# Patient Record
Sex: Male | Born: 1980 | Hispanic: No | Marital: Married | Smoking: Never smoker
Health system: Southern US, Community
[De-identification: ages and names within clinical notes are randomized; demographics above are authoritative.]

## PROBLEM LIST (undated history)

## (undated) DIAGNOSIS — E119 Type 2 diabetes mellitus without complications: Secondary | ICD-10-CM

## (undated) DIAGNOSIS — Z227 Latent tuberculosis: Secondary | ICD-10-CM

## (undated) DIAGNOSIS — E669 Obesity, unspecified: Secondary | ICD-10-CM

## (undated) HISTORY — DX: Obesity, unspecified: E66.9

## (undated) HISTORY — DX: Latent tuberculosis: Z22.7

---

## 2020-10-26 ENCOUNTER — Ambulatory Visit: Payer: Self-pay

## 2020-10-26 DIAGNOSIS — Z23 Encounter for immunization: Secondary | ICD-10-CM | POA: Diagnosis not present

## 2020-11-20 DIAGNOSIS — Z049 Encounter for examination and observation for unspecified reason: Secondary | ICD-10-CM | POA: Diagnosis not present

## 2020-11-20 DIAGNOSIS — Z111 Encounter for screening for respiratory tuberculosis: Secondary | ICD-10-CM | POA: Diagnosis not present

## 2020-11-20 DIAGNOSIS — Z114 Encounter for screening for human immunodeficiency virus [HIV]: Secondary | ICD-10-CM | POA: Diagnosis not present

## 2020-11-21 ENCOUNTER — Ambulatory Visit
Admission: RE | Admit: 2020-11-21 | Discharge: 2020-11-21 | Disposition: A | Discharge status: Home / Self Care | Payer: Medicaid Other | Source: Ambulatory Visit | Attending: Obstetrics and Gynecology | Admitting: Obstetrics and Gynecology

## 2020-11-21 ENCOUNTER — Other Ambulatory Visit: Payer: Self-pay

## 2020-11-21 ENCOUNTER — Other Ambulatory Visit: Payer: Self-pay | Admitting: Obstetrics and Gynecology

## 2020-11-21 DIAGNOSIS — R7612 Nonspecific reaction to cell mediated immunity measurement of gamma interferon antigen response without active tuberculosis: Secondary | ICD-10-CM

## 2021-01-01 DIAGNOSIS — Z111 Encounter for screening for respiratory tuberculosis: Secondary | ICD-10-CM | POA: Diagnosis not present

## 2021-01-01 DIAGNOSIS — R7612 Nonspecific reaction to cell mediated immunity measurement of gamma interferon antigen response without active tuberculosis: Secondary | ICD-10-CM | POA: Diagnosis not present

## 2021-02-06 ENCOUNTER — Other Ambulatory Visit: Payer: Self-pay | Admitting: Family Medicine

## 2021-02-06 ENCOUNTER — Other Ambulatory Visit: Payer: Self-pay

## 2021-02-06 ENCOUNTER — Other Ambulatory Visit (HOSPITAL_COMMUNITY)
Admission: RE | Admit: 2021-02-06 | Discharge: 2021-02-06 | Disposition: A | Discharge status: Home / Self Care | Payer: Medicaid Other | Source: Ambulatory Visit | Attending: Family Medicine | Admitting: Family Medicine

## 2021-02-06 ENCOUNTER — Ambulatory Visit (INDEPENDENT_AMBULATORY_CARE_PROVIDER_SITE_OTHER): Payer: Medicaid Other | Admitting: Family Medicine

## 2021-02-06 VITALS — BP 129/74 | HR 71 | Ht 69.69 in | Wt 207.6 lb

## 2021-02-06 DIAGNOSIS — H7392 Unspecified disorder of tympanic membrane, left ear: Secondary | ICD-10-CM

## 2021-02-06 DIAGNOSIS — Z8611 Personal history of tuberculosis: Secondary | ICD-10-CM | POA: Insufficient documentation

## 2021-02-06 DIAGNOSIS — K089 Disorder of teeth and supporting structures, unspecified: Secondary | ICD-10-CM | POA: Diagnosis not present

## 2021-02-06 DIAGNOSIS — Z0289 Encounter for other administrative examinations: Secondary | ICD-10-CM | POA: Diagnosis not present

## 2021-02-06 DIAGNOSIS — Z227 Latent tuberculosis: Secondary | ICD-10-CM | POA: Diagnosis not present

## 2021-02-06 LAB — POCT URINALYSIS DIP (MANUAL ENTRY)
Bilirubin, UA: NEGATIVE
Glucose, UA: NEGATIVE mg/dL
Ketones, POC UA: NEGATIVE mg/dL
Leukocytes, UA: NEGATIVE
Nitrite, UA: NEGATIVE
Protein Ur, POC: NEGATIVE mg/dL
Spec Grav, UA: >=1.03 — AB (ref 1.010–1.025)
Urobilinogen, UA: 0.2 E.U./dL
pH, UA: 5 (ref 5.0–8.0)

## 2021-02-06 LAB — POCT UA - MICROSCOPIC ONLY

## 2021-02-06 MED ORDER — IVERMECTIN 3 MG PO TABS: 200.0000 ug/kg | ORAL_TABLET | Freq: Every day | ORAL | 0 refills | Status: AC

## 2021-02-06 MED ORDER — FLUTICASONE PROPIONATE 50 MCG/ACT NA SUSP: 2.0000 | Freq: Every day | NASAL | 6 refills | Status: DC

## 2021-02-06 MED ORDER — ALBENDAZOLE 200 MG PO TABS: 400.0000 mg | ORAL_TABLET | Freq: Once | ORAL | 0 refills | Status: AC

## 2021-02-06 NOTE — Assessment & Plan Note (Addendum)
Refugee from [place] Arrived in August 2022 Labs obtained today Follow up 02/22/21 to review Empiric treatment with Ivermectin and Albendazole

## 2021-02-06 NOTE — Patient Instructions (Addendum)
It was wonderful to see you today.  Please bring ALL of your medications with you to every visit.   Today we talked about: We are referring you to the Ear Nose and Throat Doctor. You are  scheduled for May 20th at 1:45pm  [facility]  [facility] [address] [place], [place], [place] [address] [phone]  We are obtaining labs. We will discuss the labs at your follow up visit on 02/22/21 Please start taking Flonase: 2 sprays in each nostril     Thank you for choosing [facility].   Please call [phone] with any questions about today's appointment.  Please be sure to schedule follow up at the front  desk before you leave today.   Terisa Starr, MD  Family Medicine

## 2021-02-06 NOTE — Assessment & Plan Note (Signed)
IGRA positive on 06/21/20, XR normal 11/21/20.  Currently treated through Health Department INH 300mg  and Vitamin B6 25mg  started on 01/01/21.

## 2021-02-06 NOTE — Progress Notes (Signed)
Patient Name: Duane Rios Date of Birth: July 12, 1981 Date of Visit: 02/06/21 PCP: Joana Reamer, DO  Chief Complaint: refugee intake examination   The patient's preferred language is Dari. An interpreter was used for the entire visit.  Interpreter Name or ID: Mahin Taremian   Subjective: Duane Rios is a pleasant 40 y.o. presenting today for an initial refugee and immigrant clinic visit.    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 Xray 11/21/20 normal. Currently on INH 300mg  and Vitamin B6 25mg , start date 01/01/21  PSH: None  FH: Mother: HTN, deceased   Allergies:  None  Current Medications:  INH 300mg  and Vitamin B6 25mg  QD  Social History: Tobacco Use: None Alcohol Use: NOne  In the past two weeks, have you run out of food before you had money to purchase more? No In the past two weeks, have you had difficulty with obtaining food for your family? Sometimes, uses bus to get around  Information Number of Immediate Family Members: 7 (2 brothers, 2 sisters, 2 children, 1 spouse) Number of Immediate Family Members in 01/03/21: 4 Country of Birth: Country of Origin: Location of Refugee [place]:  ([place], [place]) Duration in [place]: 0-1 years Reason for Leaving Home Country:  (War) Primary Language: Dari Able to Read in Primary Language: No Able to Write in Primary Language: No Education: [employer] (3rd-5th grade level) Prior Work: Worked in [place] as [place] Marital Status: Married [place] Health Department: Positive Health Department Labs Completed: No History of Trauma: None Do You Feel Jumpy or Nervous?: No Are You Very Watchful or 'Super Alert'?: No  Date of Overseas Exam: None Review of Overseas Exam: Not applicable  Pre-Departure Treatment: none3  Overseas Vaccines Reviewed and Updated in Epic Not applicable Health Department Vaccines and imaging reviewed and  updated in Epic: yes   Vitals:   02/06/21 1343  BP: 129/74  Pulse: 71  SpO2: 98%   HEENT: Sclera anicteric. Dentition is poor throughout. No signs of acute infection. Appears well hydrated. Diffusely opaque left TM with area of erythema, no bulging.  +3 Tonsillar hypertrophy Neck: Supple, no thyromegaly, no cervical lymphadenopathy Cardiac: Regular rate and rhythm. Normal S1/S2. No murmurs, rubs, or gallops appreciated. Lungs: Clear bilaterally to ascultation.  Abdomen: Normoactive bowel sounds. No tenderness to deep or light palpation. No rebound or guarding. No hepatosplenomegaly  Extremities: Warm, well perfused without edema.  Skin: warm and dry Psych: Pleasant and appropriate  MSK: normal strength and tone, normal gait  Depression screen [facility] 2/9 02/06/2021  Decreased Interest 0  Down, Depressed, Hopeless 0  PHQ - 2 Score 0  Altered sleeping 0  Tired, decreased energy 0  Change in appetite 0  Feeling bad or failure about yourself  0  Trouble concentrating 0  Moving slowly or fidgety/restless 0  Suicidal thoughts 0  PHQ-9 Score 0    TB lung, latent IGRA positive on 06/21/20, XR normal 11/21/20.  Currently treated through Health Department INH 300mg  and Vitamin B6 25mg  started on 01/01/21.   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 Unclear if chronic fluid vs cholesteatoma. Hearing exam abnormal. Does have evidence of tonsillar hypertrophy Will refer to ENT for further evaluation. Scheduled for 03/02/21 at 1:45pm Treat with Flonase daily  Refugee health examination Refugee from 08/21/20 Arrived in August 2022 Labs obtained today Follow up 02/22/21 to review  Empiric treatment with Ivermectin and Albendazole  I have personally updated the history tabs within Epic and included the refugee information in social documentation.   Designated Market researcher signed with agency.   Release of information  signed for Health Department.   Return to care in 1 month in [facility] with resident physician and PCP - scheduled 02/22/21  Vaccines: Will need Hep A and varicella. Possible TDAP (received Td)  Orpah Cobb, DO [facility], PGY3 02/06/2021 4:33 PM

## 2021-02-06 NOTE — Assessment & Plan Note (Addendum)
Will provide dental list at follow up visit 

## 2021-02-06 NOTE — Assessment & Plan Note (Addendum)
Chronic. Unclear if chronic fluid vs cholesteatoma. Hearing exam abnormal. Does have evidence of tonsillar hypertrophy Will refer to ENT for further evaluation. Scheduled for 03/02/21 at 1:45pm Treat with Flonase daily

## 2021-02-07 LAB — CBC WITH DIFFERENTIAL/PLATELET
Basophils Absolute: 0.1 10*3/uL (ref 0.0–0.2)
Basos: 1 %
EOS (ABSOLUTE): 0.1 10*3/uL (ref 0.0–0.4)
Eos: 2 %
Hematocrit: 43.9 % (ref 37.5–51.0)
Hemoglobin: 14.9 g/dL (ref 13.0–17.7)
Immature Grans (Abs): 0 10*3/uL (ref 0.0–0.1)
Immature Granulocytes: 0 %
Lymphocytes Absolute: 2.1 10*3/uL (ref 0.7–3.1)
Lymphs: 41 %
MCH: 27.2 pg (ref 26.6–33.0)
MCHC: 33.9 g/dL (ref 31.5–35.7)
MCV: 80 fL (ref 79–97)
Monocytes Absolute: 0.4 10*3/uL (ref 0.1–0.9)
Monocytes: 8 %
Neutrophils Absolute: 2.5 10*3/uL (ref 1.4–7.0)
Neutrophils: 48 %
Platelets: 322 10*3/uL (ref 150–450)
RBC: 5.47 x10E6/uL (ref 4.14–5.80)
RDW: 11.8 % (ref 11.6–15.4)
WBC: 5.1 10*3/uL (ref 3.4–10.8)

## 2021-02-07 LAB — COMPREHENSIVE METABOLIC PANEL
ALT: 26 IU/L (ref 0–44)
AST: 15 IU/L (ref 0–40)
Albumin/Globulin Ratio: 1.6 (ref 1.2–2.2)
Albumin: 4.4 g/dL (ref 4.0–5.0)
Alkaline Phosphatase: 93 IU/L (ref 44–121)
BUN/Creatinine Ratio: 11 (ref 9–20)
BUN: 10 mg/dL (ref 6–20)
Bilirubin Total: 0.4 mg/dL (ref 0.0–1.2)
CO2: 23 mmol/L (ref 20–29)
Calcium: 9.7 mg/dL (ref 8.7–10.2)
Chloride: 105 mmol/L (ref 96–106)
Creatinine, Ser: 0.88 mg/dL (ref 0.76–1.27)
Globulin, Total: 2.8 g/dL (ref 1.5–4.5)
Glucose: 112 mg/dL — ABNORMAL HIGH (ref 65–99)
Potassium: 4.2 mmol/L (ref 3.5–5.2)
Sodium: 141 mmol/L (ref 134–144)
Total Protein: 7.2 g/dL (ref 6.0–8.5)
eGFR: 113 mL/min/{1.73_m2} (ref >59)

## 2021-02-07 LAB — LIPID PANEL
Chol/HDL Ratio: 5.1 ratio — ABNORMAL HIGH (ref 0.0–5.0)
Cholesterol, Total: 220 mg/dL — ABNORMAL HIGH (ref 100–199)
HDL: 43 mg/dL (ref >39)
LDL Chol Calc (NIH): 155 mg/dL — ABNORMAL HIGH (ref 0–99)
Triglycerides: 121 mg/dL (ref 0–149)
VLDL Cholesterol Cal: 22 mg/dL (ref 5–40)

## 2021-02-07 LAB — HEPATITIS B CORE ANTIBODY, TOTAL: Hep B Core Total Ab: NEGATIVE

## 2021-02-07 LAB — TSH: TSH: 0.982 u[IU]/mL (ref 0.450–4.500)

## 2021-02-07 LAB — HIV ANTIBODY (ROUTINE TESTING W REFLEX): HIV Screen 4th Generation wRfx: NONREACTIVE

## 2021-02-07 LAB — HEPATITIS B SURFACE ANTIBODY, QUANTITATIVE: Hepatitis B Surf Ab Quant: 4.8 m[IU]/mL — ABNORMAL LOW (ref >9.9)

## 2021-02-07 LAB — HEPATITIS B SURFACE ANTIGEN: Hepatitis B Surface Ag: NEGATIVE

## 2021-02-07 LAB — HCV INTERPRETATION

## 2021-02-07 LAB — HCV AB W REFLEX TO QUANT PCR: HCV Ab: <0.1 s/co ratio (ref 0.0–0.9)

## 2021-02-07 LAB — RPR: RPR Ser Ql: NONREACTIVE

## 2021-02-08 LAB — URINE CYTOLOGY ANCILLARY ONLY
Chlamydia: NEGATIVE
Comment: NEGATIVE
Comment: NORMAL
Neisseria Gonorrhea: NEGATIVE

## 2021-02-09 LAB — URINE CULTURE: Organism ID, Bacteria: NO GROWTH

## 2021-02-18 NOTE — Progress Notes (Signed)
   Subjective:   Patient ID: Duane Rios    DOB: 19-Jun-1981, 40 y.o. male   MRN: [id number]  Blas Riches is a 40 y.o. male with a history of latent TB, poor dentition, abnormal TM of left ear here for follow up  Acute Concerns: Unable to afford the Ivermectin medication. They completed the Albendazole without complication.  HPI: Patient here to discuss lab results  Review of Systems:  Per HPI.   Objective:   BP (!) 135/93   Pulse 84   Wt 209 lb 6.4 oz (95 kg)   SpO2 99%   BMI 30.32 kg/m  Vitals and nursing note reviewed.  General: pleasant older male, sitting comfortably in exam chair, well nourished, well developed, in no acute distress with non-toxic appearance Resp: breathing comfortably on room air, speaking in full sentences MSK:  gait normal Neuro: Alert and oriented, speech normal  Assessment & Plan:   Refugee health examination New rx for Ivermectin provided and encouraged to go to [facility]. Will need to complete PA to have insurance cover medication otherwise will be $30 out of pocket. Will await forms to complete.   Mixed hyperlipidemia Discussed lab results. Recommended lifestyle modifications.  Nonimmune to hepatitis B virus Has received 1 dose of Hep B 10/26/20 Dose #2 given 02/22/21 Dose #3 can be given in 2 months (8 weeks after dose 2, at least 16 weeks from dose 1)  Orders Placed This Encounter  Procedures  . Hepatitis B vaccine adult IM   Meds ordered this encounter  Medications  . DISCONTD: ivermectin (STROMECTOL) 3 MG TABS tablet    Sig: Take 6.5 tablets (19.5 mg total) by mouth daily for 2 doses.    Dispense:  13 tablet    Refill:  0  . DISCONTD: albendazole (ALBENZA) 200 MG tablet    Sig: Take 2 tablets (400 mg total) by mouth once for 1 dose.    Dispense:  2 tablet    Refill:  0      Orpah Cobb, DO PGY-3, [facility] 02/23/2021 8:07 AM

## 2021-02-22 ENCOUNTER — Encounter: Payer: Self-pay | Admitting: Family Medicine

## 2021-02-22 ENCOUNTER — Other Ambulatory Visit: Payer: Self-pay

## 2021-02-22 ENCOUNTER — Other Ambulatory Visit (HOSPITAL_COMMUNITY): Payer: Self-pay

## 2021-02-22 ENCOUNTER — Ambulatory Visit (INDEPENDENT_AMBULATORY_CARE_PROVIDER_SITE_OTHER): Payer: Medicaid Other | Admitting: Family Medicine

## 2021-02-22 VITALS — BP 135/93 | HR 84 | Wt 209.4 lb

## 2021-02-22 DIAGNOSIS — E782 Mixed hyperlipidemia: Secondary | ICD-10-CM

## 2021-02-22 DIAGNOSIS — Z0289 Encounter for other administrative examinations: Secondary | ICD-10-CM

## 2021-02-22 DIAGNOSIS — Z23 Encounter for immunization: Secondary | ICD-10-CM

## 2021-02-22 DIAGNOSIS — Z789 Other specified health status: Secondary | ICD-10-CM

## 2021-02-22 MED ORDER — IVERMECTIN 3 MG PO TABS
19.0000 mg | ORAL_TABLET | Freq: Every day | ORAL | 0 refills | Status: DC
  Filled 2021-02-22: qty 10, 29d supply, fill #0

## 2021-02-22 MED ORDER — IVERMECTIN 3 MG PO TABS
19.0000 mg | ORAL_TABLET | Freq: Every day | ORAL | 0 refills | Status: AC
  Filled 2021-02-22: qty 10, 14d supply, fill #0
  Filled 2021-02-22: qty 13, 2d supply, fill #0
  Filled 2021-02-23: qty 3, 3d supply, fill #1

## 2021-02-22 MED ORDER — ALBENDAZOLE 200 MG PO TABS: 400.0000 mg | ORAL_TABLET | Freq: Once | ORAL | 0 refills | Status: DC

## 2021-02-22 NOTE — Patient Instructions (Addendum)
Please go to [facility] at  [address] [place], [place], [place] [address]  to obtain the Ivermectin   You also got your Hepatitis B vaccine today. The next vaccine will need to be given in 2 months.

## 2021-02-23 ENCOUNTER — Other Ambulatory Visit (HOSPITAL_COMMUNITY): Payer: Self-pay

## 2021-02-23 DIAGNOSIS — E782 Mixed hyperlipidemia: Secondary | ICD-10-CM | POA: Insufficient documentation

## 2021-02-23 DIAGNOSIS — Z789 Other specified health status: Secondary | ICD-10-CM | POA: Insufficient documentation

## 2021-02-23 NOTE — Assessment & Plan Note (Signed)
Discussed lab results. Recommended lifestyle modifications.

## 2021-02-23 NOTE — Assessment & Plan Note (Signed)
New rx for Ivermectin provided and encouraged to go to [facility]. Will need to complete PA to have insurance cover medication otherwise will be $30 out of pocket. Will await forms to complete.

## 2021-02-23 NOTE — Assessment & Plan Note (Signed)
Has received 1 dose of Hep B 10/26/20 Dose #2 given 02/22/21 Dose #3 can be given in 2 months (8 weeks after dose 2, at least 16 weeks from dose 1)

## 2021-04-06 DIAGNOSIS — H903 Sensorineural hearing loss, bilateral: Secondary | ICD-10-CM | POA: Diagnosis not present

## 2021-04-06 DIAGNOSIS — H7312 Chronic myringitis, left ear: Secondary | ICD-10-CM | POA: Diagnosis not present

## 2021-04-20 DIAGNOSIS — Z09 Encounter for follow-up examination after completed treatment for conditions other than malignant neoplasm: Secondary | ICD-10-CM | POA: Diagnosis not present

## 2021-04-20 DIAGNOSIS — Z8669 Personal history of other diseases of the nervous system and sense organs: Secondary | ICD-10-CM | POA: Diagnosis not present

## 2021-07-24 DIAGNOSIS — Z7189 Other specified counseling: Secondary | ICD-10-CM | POA: Diagnosis not present

## 2021-07-24 DIAGNOSIS — Z23 Encounter for immunization: Secondary | ICD-10-CM | POA: Diagnosis not present

## 2021-08-20 DIAGNOSIS — Z23 Encounter for immunization: Secondary | ICD-10-CM | POA: Diagnosis not present

## 2021-10-28 IMAGING — CR DG CHEST 1V
1 series · 1 of 1 positions shown · non-contrast
Comparison: None

CLINICAL DATA: Positive TB test, no chest complaints, nonsmoker

EXAM:
CHEST  1 VIEW

[w chest pa]
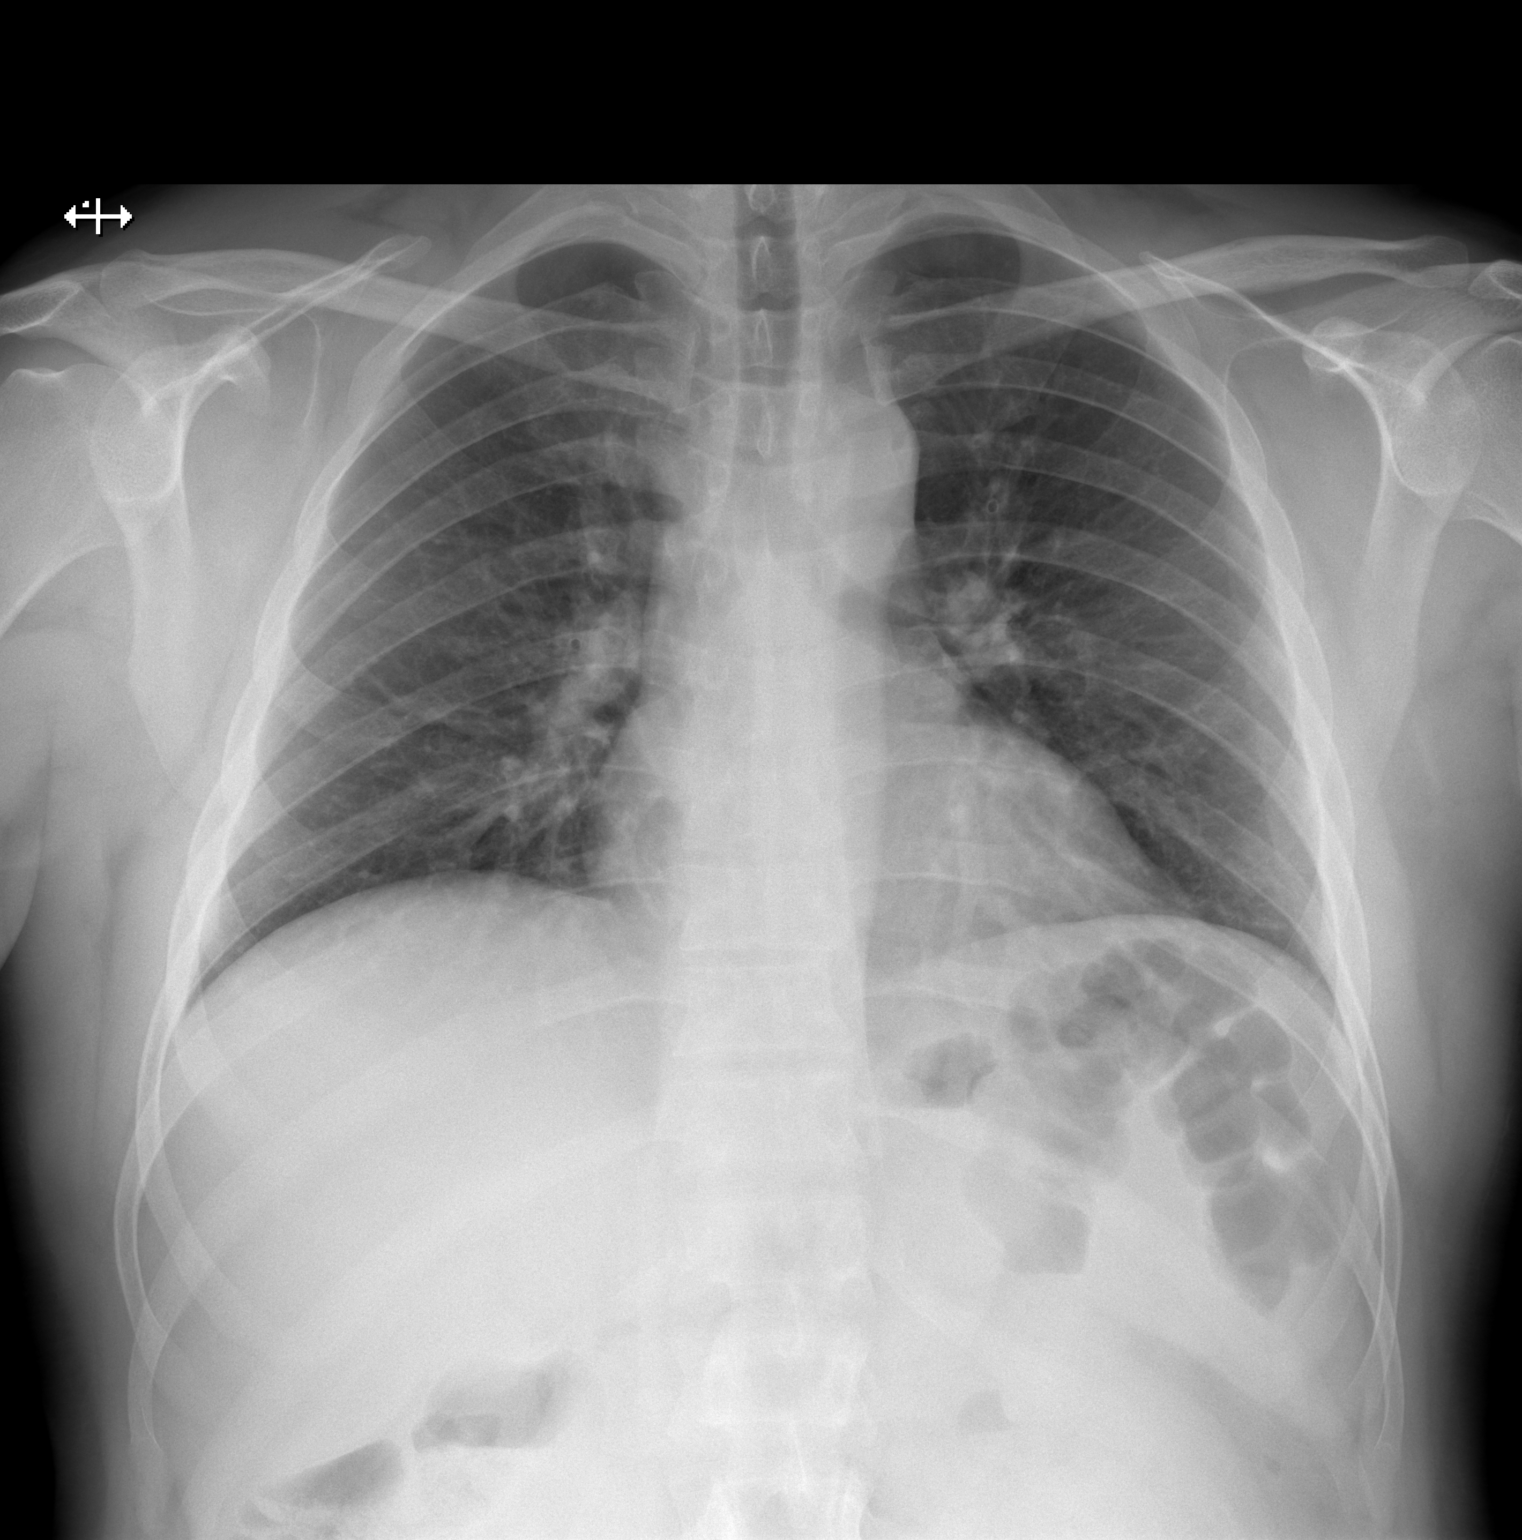

[1 of 1 positions shown; findings below may reference images not displayed]

FINDINGS: No consolidation, features of edema, pneumothorax, or effusion. No
suspicious pulmonary nodules or masses. No significant architectural
distortion or cavitary change. The cardiomediastinal contours are
unremarkable. No acute osseous or soft tissue abnormality.
IMPRESSION: Unremarkable chest radiograph

## 2022-02-04 ENCOUNTER — Encounter: Payer: Self-pay | Admitting: Family Medicine

## 2022-02-04 ENCOUNTER — Ambulatory Visit (INDEPENDENT_AMBULATORY_CARE_PROVIDER_SITE_OTHER): Payer: Medicaid Other | Admitting: Family Medicine

## 2022-02-04 VITALS — BP 128/82 | HR 84 | Ht 70.0 in | Wt 197.2 lb

## 2022-02-04 DIAGNOSIS — Z0000 Encounter for general adult medical examination without abnormal findings: Secondary | ICD-10-CM

## 2022-02-04 NOTE — Progress Notes (Signed)
? ? ? ? ?  SUBJECTIVE:  ? ?Chief compliant/HPI: annual examination ? ?Duane Rios is a 41 y.o. who presents today for an annual exam.  ? ?Dari IPAD interpreter used. ID # [id number] ? 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 ?Denies taking medication ? ?Denies tobacco, alcohol, or recreation drug use ? ?Married ?Endorses mood is good. No concern for anxiety or depression ? ? ?OBJECTIVE:  ? ?BP 128/82   Pulse 84   Ht 5\' 10"  (1.778 m)   Wt 197 lb 3.2 oz (89.4 kg)   SpO2 99%   BMI 28.30 kg/m?   ? ?Physical exam ?General: well appearing, NAD ?Cardiovascular: RRR, no murmurs ?Lungs: CTAB. Normal WOB ?Abdomen: soft, non-distended, non-tender ?Skin: warm, dry. No edema ? ?ASSESSMENT/PLAN:  ? ?No problem-specific Assessment & Plan notes found for this encounter. ?  ? ?Annual Examination  ?See AVS for age appropriate recommendations  ?PHQ score 0, reviewed and discussed.  ?Blood pressure reviewed and at goal.    ? ?Considered the following items based upon USPSTF recommendations: ?HIV testing: not indicated ?Hepatitis C: not indicated ?Hepatitis B: not indicated ?Syphilis if at high risk: {not indicated ?GC/CTnot indicated ?Lipid panel (nonfasting or fasting) discussed based upon AHA recommendations and not ordered.  Consider repeat every 4-6 years.  ? ? ?Follow up in 1 year or sooner if indicated.  ? ? ? , DO ?[facility]   ?

## 2022-02-04 NOTE — Patient Instructions (Addendum)
It was great seeing you today! ? ?You came in for your annual exam and glad to see you are doing well!  ? ?Continue watching nutrition, and eating well-balanced meals with vegetables at each meal, and getting in physical activity when you can.  ? ?We will see you back in 1 year for your next physical, but if you need to be seen earlier than that for any new issues we're happy to fit you in, just give [place] a call! ? ?Visit Reminders: ?- Continue to work on your healthy eating habits and incorporating exercise into your daily life.  ? ?Feel free to call with any questions or concerns at any time, at [phone]. ?  ?Take care,  ?Dr. Shary Key ?[place]  ?

## 2022-10-19 ENCOUNTER — Ambulatory Visit (HOSPITAL_COMMUNITY)
Admission: EM | Admit: 2022-10-19 | Discharge: 2022-10-19 | Disposition: A | Discharge status: Home / Self Care | Payer: BC Managed Care – PPO | Attending: Emergency Medicine | Admitting: Emergency Medicine

## 2022-10-19 ENCOUNTER — Encounter (HOSPITAL_COMMUNITY): Payer: Self-pay | Admitting: *Deleted

## 2022-10-19 ENCOUNTER — Other Ambulatory Visit: Payer: Self-pay

## 2022-10-19 DIAGNOSIS — S29019A Strain of muscle and tendon of unspecified wall of thorax, initial encounter: Secondary | ICD-10-CM

## 2022-10-19 DIAGNOSIS — M62830 Muscle spasm of back: Secondary | ICD-10-CM

## 2022-10-19 DIAGNOSIS — S29012A Strain of muscle and tendon of back wall of thorax, initial encounter: Secondary | ICD-10-CM

## 2022-10-19 MED ORDER — KETOROLAC TROMETHAMINE 60 MG/2ML IM SOLN
60.0000 mg | Freq: Once | INTRAMUSCULAR | Status: AC
  Administered 2022-10-19: 60 mg via INTRAMUSCULAR

## 2022-10-19 MED ORDER — KETOROLAC TROMETHAMINE 60 MG/2ML IM SOLN
INTRAMUSCULAR | Status: AC
  Filled 2022-10-19: qty 2

## 2022-10-19 MED ORDER — CYCLOBENZAPRINE HCL 10 MG PO TABS: 10.0000 mg | ORAL_TABLET | Freq: Two times a day (BID) | ORAL | 0 refills | Status: AC | PRN

## 2022-10-19 NOTE — Discharge Instructions (Addendum)
Apply heat or ice to back 20 min 3 times daily. You received Toradol in Urgent Care. Take flexeril as directed(will make you sleepy). May alternate tylenol and ibuprofen as label directed for pain. Follow up with WC/Occupational health at [facility] for appt. Go to Er for loss of function, loss of bowel and bladder, saddle numbness.

## 2022-10-19 NOTE — ED Triage Notes (Signed)
Pt reports mid back after doing heavy work at work.

## 2022-10-19 NOTE — ED Provider Notes (Signed)
[facility]    CSN: [id number] Arrival date & time: 10/19/22  1208      History   Chief Complaint Chief Complaint  Patient presents with   Back Pain    HPI Duane Rios is a 42 y.o. male.   42 year old male patient, Duane Rios, presents to urgent care with chief complaint of mid upper back after doing heavy pulling and physical activity at work.  Was seen by occupational health 2 days prior and given Tylenol and advised to ice, no improvement.  Pain with movement.  Patient denies any fall or direct blunt force trauma to area. Pt denies any loss of function, no saddle numbness, no loss of bowel and bladder.   The history is provided by the patient. No language interpreter was used.    Past Medical History:  Diagnosis Date   Obesity    TB lung, latent     Patient Active Problem List   Diagnosis Date Noted   Thoracic myofascial strain 10/19/2022   Muscle spasm of back 10/19/2022   Nonimmune to hepatitis B virus 02/23/2021   Mixed hyperlipidemia 02/23/2021   Poor dentition 02/06/2021   Abnormal tympanic membrane of left ear 02/06/2021   Refugee health examination 02/06/2021   TB lung, latent 02/06/2021    History reviewed. No pertinent surgical history.     Home Medications    Prior to Admission medications   Medication Sig Start Date End Date Taking? Authorizing Provider  cyclobenzaprine (FLEXERIL) 10 MG tablet Take 1 tablet (10 mg total) by mouth 2 (two) times daily as needed for up to 5 days for muscle spasms. 10/19/22 10/24/22 Yes Corwyn Vora, Para March, NP  fluticasone (FLONASE) 50 MCG/ACT nasal spray Place 2 sprays into both nostrils daily. 02/06/21   Mullis, Dara Lords, DO    Family History Family History  Problem Relation Age of Onset   Hypertension Mother     Social History Social History   Tobacco Use   Smoking status: Never   Smokeless tobacco: Never  Vaping Use   Vaping Use: Never used  Substance Use Topics   Alcohol use: Never    Drug use: Never     Allergies   Patient has no known allergies.   Review of Systems Review of Systems  Genitourinary:  Negative for dysuria.  Musculoskeletal:  Positive for back pain and myalgias.  Skin:  Negative for rash.  All other systems reviewed and are negative.    Physical Exam Triage Vital Signs ED Triage Vitals [10/19/22 1228]  Enc Vitals Group     BP      Pulse      Resp      Temp      Temp src      SpO2      Weight      Height      Head Circumference      Peak Flow      Pain Score 7     Pain Loc      Pain Edu?      Excl. in GC?    No data found.  Updated Vital Signs BP 119/77   Pulse 83   Temp 98.6 F (37 C)   Resp 18   SpO2 98%   Visual Acuity Right Eye Distance:   Left Eye Distance:   Bilateral Distance:    Right Eye Near:   Left Eye Near:    Bilateral Near:     Physical Exam Vitals and nursing note  reviewed.  Constitutional:      Appearance: He is well-developed and well-groomed. He is not ill-appearing or toxic-appearing.  Cardiovascular:     Rate and Rhythm: Normal rate and regular rhythm.     Pulses: Normal pulses.          Dorsalis pedis pulses are 2+ on the right side and 2+ on the left side.     Heart sounds: Normal heart sounds.  Pulmonary:     Effort: Pulmonary effort is normal.     Breath sounds: Normal breath sounds and air entry.  Musculoskeletal:     Thoracic back: Spasms present. No bony tenderness.     Lumbar back: No swelling, edema, deformity, lacerations or bony tenderness. Normal range of motion.       Back:     Comments: Pain with movement, + spasm/tightness  Neurological:     General: No focal deficit present.     Mental Status: He is alert and oriented to person, place, and time.     GCS: GCS eye subscore is 4. GCS verbal subscore is 5. GCS motor subscore is 6.     Cranial Nerves: No cranial nerve deficit.     Sensory: No sensory deficit.     Deep Tendon Reflexes: Reflexes are normal and symmetric.   Psychiatric:        Attention and Perception: Attention normal.        Mood and Affect: Mood normal.        Speech: Speech normal.        Behavior: Behavior is cooperative.      UC Treatments / Results  Labs (all labs ordered are listed, but only abnormal results are displayed) Labs Reviewed - No data to display  EKG   Radiology No results found.  Procedures Procedures (including critical care time)  Medications Ordered in UC Medications  ketorolac (TORADOL) injection 60 mg (60 mg Intramuscular Given 10/19/22 1321)    Initial Impression / Assessment and Plan / UC Course  I have reviewed the triage vital signs and the nursing notes.  Pertinent labs & imaging results that were available during my care of the patient were reviewed by me and considered in my medical decision making (see chart for details).     Ddx: Back pain,spasm, muscle strain  Toradol 60mg  IM given in UC for pain.  Final Clinical Impressions(s) / UC Diagnoses   Final diagnoses:  Thoracic myofascial strain, initial encounter  Muscle spasm of back     Discharge Instructions      Apply heat or ice to back 20 min 3 times daily. You received Toradol in Urgent Care. Take flexeril as directed(will make you sleepy). May alternate tylenol and ibuprofen as label directed for pain. Follow up with WC/Occupational health at [facility] for appt. Go to Er for loss of function, loss of bowel and bladder, saddle numbness.      ED Prescriptions     Medication Sig Dispense Auth. Provider   cyclobenzaprine (FLEXERIL) 10 MG tablet Take 1 tablet (10 mg total) by mouth 2 (two) times daily as needed for up to 5 days for muscle spasms. 10 tablet Lexus Barletta, Jeanett Schlein, NP      PDMP not reviewed this encounter.   Tori Milks, NP 53/97/67 1554

## 2023-08-01 ENCOUNTER — Other Ambulatory Visit: Payer: Self-pay

## 2023-08-01 ENCOUNTER — Ambulatory Visit (INDEPENDENT_AMBULATORY_CARE_PROVIDER_SITE_OTHER): Payer: Medicaid Other | Admitting: Family Medicine

## 2023-08-01 ENCOUNTER — Other Ambulatory Visit (HOSPITAL_COMMUNITY)
Admission: RE | Admit: 2023-08-01 | Discharge: 2023-08-01 | Disposition: A | Discharge status: Home / Self Care | Payer: Medicaid Other | Source: Ambulatory Visit | Attending: Family Medicine | Admitting: Family Medicine

## 2023-08-01 VITALS — BP 135/99 | HR 72 | Ht 70.0 in | Wt 214.0 lb

## 2023-08-01 DIAGNOSIS — R300 Dysuria: Secondary | ICD-10-CM | POA: Insufficient documentation

## 2023-08-01 DIAGNOSIS — Z8611 Personal history of tuberculosis: Secondary | ICD-10-CM

## 2023-08-01 DIAGNOSIS — E782 Mixed hyperlipidemia: Secondary | ICD-10-CM | POA: Diagnosis not present

## 2023-08-01 DIAGNOSIS — Z23 Encounter for immunization: Secondary | ICD-10-CM

## 2023-08-01 LAB — POCT UA - MICROSCOPIC ONLY
Bacteria, U Microscopic: NONE SEEN
WBC, Ur, HPF, POC: NONE SEEN (ref 0–5)

## 2023-08-01 NOTE — Progress Notes (Unsigned)
    SUBJECTIVE:   CHIEF COMPLAINT / HPI:   UTI Symptoms  Patient has been having dysuria for one week now. Patient had frequency.  Noticed two "stones" that were brown in color. He felt like he would sometimes go to the bathroom and felt like he could not urinate much. Denies urgency. Has never had stones before. About 4-5 years ago in [place] patient had similar symptoms and was told that he had a UTI. He says at that time he was treated and felt better. Since then occasionally he would have burning with urination but would be self limited. No back pain. No fever. No abdominal pain, nausea, vomiting. Patient says he gets white discharge.   Denies history of STI. Denies kidney problems in the past.   No family history of kidney or bladder problems.   PERTINENT  PMH / PSH: latent TB, treated.   OBJECTIVE:   BP (!) 136/100   Pulse 83   Ht 5\' 10"  (1.778 m)   Wt 214 lb (97.1 kg)   SpO2 98%   BMI 30.71 kg/m   General: well appearing, in no acute distress CV: RRR, radial pulses equal and palpable Resp: Normal work of breathing on room air Abd: Soft, non tender, non distended  Neuro: Alert & Oriented x 4    ASSESSMENT/PLAN:   Assessment & Plan Dysuria Concern for possible chronic prostatitis given that patient has had this long indolent course.  Additionally presence of pus like discharge also could be an indicator as patient denies history of STIs or sexual activity other than 1 partner.  Could also be concerning for UTI. - If UA is significant for bacteria would treat with antibiotics given could be either UTI or acute on chronic bacterial prostatitis -If UA not show any bacteria, would treat with Pyridium for inflammation and NSAIDs and refer to urology for chronic prostatitis -Urine GC/CT, HIV, CMP   Healthcare maintenance - Lipid panel - Flu and COVID vaccines - Recommended patient make appointment for physical with primary care physician   Lockie Mola, MD [facility]  [facility]

## 2023-08-01 NOTE — Patient Instructions (Addendum)
???? ?? ????? ????? ?????.   ??? ??? ???? ?? ????? ????? ????? ?? ????? ????????? ??????? ????. ?? ??????? ????? ??? ?? ?? ????? ??? ? ?? ???? ?? ???? ?????? ?? ???? ???? ???? ?????? ????? ???. ?? ??? ??? ???????? ??????? ? ?????????? ?? ???? ??? ?? ??? ???? ????.  ??????? ?? ??????? ????? ????? ????????? ???? ? ???? ??? ???? ??? ??? ????????? ?????.   ??? ???? ?? ?? ??? ???? ?? ?????? ????? ?? ????? ??? ???? ?????? ???????.     Thank you for coming today.   Your pain could be from urinary tract infection or bacterial infection of the prostate. I will check your urine studies and follow up regarding an antibiotic to take. In the meantime you can take tylenol and ibuprofen to help with pain.  Additionally I am going to get some labs because of your history of high cholesterol and high blood pressure.   You should make an appointment for a physical with your doctor in a month.

## 2023-08-02 LAB — COMPREHENSIVE METABOLIC PANEL
ALT: 33 [IU]/L (ref 0–44)
AST: 19 [IU]/L (ref 0–40)
Albumin: 4.4 g/dL (ref 4.1–5.1)
Alkaline Phosphatase: 106 [IU]/L (ref 44–121)
BUN/Creatinine Ratio: 17 (ref 9–20)
BUN: 16 mg/dL (ref 6–24)
Bilirubin Total: 0.4 mg/dL (ref 0.0–1.2)
CO2: 23 mmol/L (ref 20–29)
Calcium: 9.6 mg/dL (ref 8.7–10.2)
Chloride: 103 mmol/L (ref 96–106)
Creatinine, Ser: 0.95 mg/dL (ref 0.76–1.27)
Globulin, Total: 2.8 g/dL (ref 1.5–4.5)
Glucose: 96 mg/dL (ref 70–99)
Potassium: 4.2 mmol/L (ref 3.5–5.2)
Sodium: 139 mmol/L (ref 134–144)
Total Protein: 7.2 g/dL (ref 6.0–8.5)
eGFR: 104 mL/min/{1.73_m2} (ref >59)

## 2023-08-02 LAB — LIPID PANEL
Chol/HDL Ratio: 6.1 {ratio} — ABNORMAL HIGH (ref 0.0–5.0)
Cholesterol, Total: 230 mg/dL — ABNORMAL HIGH (ref 100–199)
HDL: 38 mg/dL — ABNORMAL LOW (ref >39)
LDL Chol Calc (NIH): 146 mg/dL — ABNORMAL HIGH (ref 0–99)
Triglycerides: 249 mg/dL — ABNORMAL HIGH (ref 0–149)
VLDL Cholesterol Cal: 46 mg/dL — ABNORMAL HIGH (ref 5–40)

## 2023-08-02 LAB — HIV ANTIBODY (ROUTINE TESTING W REFLEX): HIV Screen 4th Generation wRfx: NONREACTIVE

## 2023-08-04 LAB — URINE CYTOLOGY ANCILLARY ONLY
Chlamydia: NEGATIVE
Comment: NEGATIVE
Comment: NEGATIVE
Comment: NORMAL
Neisseria Gonorrhea: NEGATIVE
Trichomonas: NEGATIVE

## 2023-08-08 ENCOUNTER — Telehealth: Payer: Self-pay | Admitting: Family Medicine

## 2023-08-08 DIAGNOSIS — R300 Dysuria: Secondary | ICD-10-CM

## 2023-08-08 MED ORDER — PHENAZOPYRIDINE HCL 100 MG PO TABS: 100.0000 mg | ORAL_TABLET | Freq: Three times a day (TID) | ORAL | 0 refills | Status: AC | PRN

## 2023-08-08 NOTE — Telephone Encounter (Signed)
-----   Message from Janit Pagan sent at 08/02/2023 10:57 AM EDT -----  ----- Message ----- From: Jennette Bill, CMA Sent: 08/01/2023   2:12 PM EDT To: Doreene Eland, MD

## 2023-08-08 NOTE — Addendum Note (Signed)
Addended by: Lockie Mola on: 08/08/2023 03:15 PM   Modules accepted: Orders

## 2023-08-08 NOTE — Telephone Encounter (Signed)
Called patient with Dari interpreter to discuss patient lab results. Patient did not pick up the phone, left HIPAA compliant voice mail saying that I have sent a medication to the pharmacy and to call back our clinic.   Labs did not indicate that patient had bacteria in his urine. 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   I am prescribing pyridium for a 5 day course for discomfort. If this does not resolve the pain, can consider adding tamsulosin. However, will prescribe just pyridium at this time. I will also send a referral to urology.   Will also need to follow up the ua as there were 10-20 RBC in the urine sample. Will need to retest for clearance of blood.   Lockie Mola, MD

## 2023-08-22 ENCOUNTER — Encounter: Payer: Self-pay | Admitting: Student

## 2023-08-22 ENCOUNTER — Ambulatory Visit (INDEPENDENT_AMBULATORY_CARE_PROVIDER_SITE_OTHER): Payer: Medicaid Other | Admitting: Student

## 2023-08-22 VITALS — BP 136/98 | HR 83 | Ht 68.0 in | Wt 216.4 lb

## 2023-08-22 DIAGNOSIS — E119 Type 2 diabetes mellitus without complications: Secondary | ICD-10-CM | POA: Insufficient documentation

## 2023-08-22 DIAGNOSIS — Z0000 Encounter for general adult medical examination without abnormal findings: Secondary | ICD-10-CM

## 2023-08-22 DIAGNOSIS — Z13220 Encounter for screening for lipoid disorders: Secondary | ICD-10-CM

## 2023-08-22 DIAGNOSIS — R7303 Prediabetes: Secondary | ICD-10-CM

## 2023-08-22 DIAGNOSIS — Z131 Encounter for screening for diabetes mellitus: Secondary | ICD-10-CM

## 2023-08-22 DIAGNOSIS — R300 Dysuria: Secondary | ICD-10-CM | POA: Diagnosis not present

## 2023-08-22 LAB — POCT GLYCOSYLATED HEMOGLOBIN (HGB A1C): Hemoglobin A1C: 6.5 % — AB (ref 4.0–5.6)

## 2023-08-22 MED ORDER — METFORMIN HCL ER 500 MG PO TB24: 500.0000 mg | ORAL_TABLET | Freq: Every day | ORAL | 1 refills | Status: DC

## 2023-08-22 NOTE — Assessment & Plan Note (Addendum)
Patient continues to complain of dysuria with burning urination, and some discharge, and blood.  Urine was tested for microscopy but not urinalysis, will test today.  May consider Pyridium if UA clean and symptoms continue.  Patient denies any systemic symptoms low concern for pyelonephritis. - UA -Consider Pyridium if UA negative

## 2023-08-22 NOTE — Assessment & Plan Note (Addendum)
Patient comes in for annual physical exam.  Patient has complaint of dysuria, see dysuria problem.  Also screening patient for cholesterol, and diabetes.  Patient noted to have prediabetes on A1c today.  Blood pressure noted to be slightly elevated we will continue to monitor, may need antihypertensives in the future. - Prediabetes management - Lipid panel - Follow-up BP

## 2023-08-22 NOTE — Progress Notes (Signed)
  SUBJECTIVE:   CHIEF COMPLAINT / HPI:      SUBJECTIVE:   Chief compliant/HPI: annual examination  Duane Rios is a 42 y.o. who presents today for an annual exam.   Dysuria Seen 10/18 w/ negative Umicro. ?Pyridium Today, notes continues to have white mucous when peeing, and  pain and a drop of blood. Peeing normal amount 2-3 times a day and full voids.    OBJECTIVE:  BP (!) 136/98   Pulse 83   Ht 5\' 8"  (1.727 m)   Wt 216 lb 6.4 oz (98.2 kg)   SpO2 100%   BMI 32.90 kg/m  Physical Exam Constitutional:      General: He is not in acute distress.    Appearance: Normal appearance. He is not ill-appearing.  Cardiovascular:     Rate and Rhythm: Normal rate and regular rhythm.     Pulses: Normal pulses.     Heart sounds: Normal heart sounds. No murmur heard.    No friction rub. No gallop.  Pulmonary:     Effort: Pulmonary effort is normal. No respiratory distress.     Breath sounds: Normal breath sounds. No stridor. No wheezing, rhonchi or rales.  Abdominal:     General: There is no distension.     Palpations: There is no mass.     Tenderness: There is no abdominal tenderness. There is no guarding.     Hernia: No hernia is present.  Skin:    Capillary Refill: Capillary refill takes less than 2 seconds.  Neurological:     Mental Status: He is alert.  Psychiatric:        Mood and Affect: Mood normal.        Behavior: Behavior normal.      ASSESSMENT/PLAN:   Assessment & Plan Dysuria Patient continues to complain of dysuria with burning urination, and some discharge, and blood.  Urine was tested for microscopy but not urinalysis, will test today.  May consider Pyridium if UA clean and symptoms continue.  Patient denies any systemic symptoms low concern for pyelonephritis. - UA -Consider Pyridium if UA negative Prediabetes Patient A1c 6.5 today making him prediabetic.  Discussed diet and exercise with patient, avoiding carbs/starches, patient believes this is large  part of his diet, and would like to start medication while also working on diet.  Will start metformin. - Metformin 500 mg XR daily - Follow-up 3 months - Diet and exercise recommended Annual physical exam Patient comes in for annual physical exam.  Patient has complaint of dysuria, see dysuria problem.  Also screening patient for cholesterol, and diabetes.  Patient noted to have prediabetes on A1c today.  Blood pressure noted to be slightly elevated we will continue to monitor, may need antihypertensives in the future. - Prediabetes management - Lipid panel - Follow-up BP No follow-ups on file. Bess Kinds, MD 08/22/2023, 5:08 PM PGY-3, [facility]

## 2023-08-22 NOTE — Assessment & Plan Note (Addendum)
Patient A1c 6.5 today making him prediabetic.  Discussed diet and exercise with patient, avoiding carbs/starches, patient believes this is large part of his diet, and would like to start medication while also working on diet.  Will start metformin. - Metformin 500 mg XR daily - Follow-up 3 months - Diet and exercise recommended

## 2023-08-22 NOTE — Patient Instructions (Addendum)
It was great to see you! Thank you for allowing me to participate in your care!  I recommend that you always bring your medications to each appointment as this makes it easy to ensure we are on the correct medications and helps [place] not miss when refills are needed.  Our plans for today:  - Check up  Diabetes  You have prediabetes and are at risk for progressing to full diabetes. We will start you on a medication to be taken in the evenings.   Metformin XR 500 mg daily  Make follow up appointment in 3 months Also work on eating more vegetables and protein than carbohydrates (starches)   Checking Cholesterol level  - Pain with Urination We are testing your urine for an infection. If it's negative, we may still be able to give you something to help with the symptoms.   We are checking some labs today, I will call you if they are abnormal will send you a MyChart message or a letter if they are normal.  If you do not hear about your labs in the next 2 weeks please let us know.  Take care and seek immediate care sooner if you develop any concerns.   Dr. Bess Kinds, MD [facility]     ???? ??? ???? ???! ???? ?? ????? ?? ?? ????? ????? ?? ?? ?????? ??? ?????? ???!  ?? ????? ????? ?? ??? ????? ????? ??? ??? ?? ?? ?? ?????? ??????? ???? ??? ??? ????? ??? ?? ????? ?? ????? ??? ???? ?? ???? ?????? ???? ?????? ? ?? ?? ??? ????? ?? ??????? ???? ?? ?? ???? ???? ???? ?? ??? ?????.  ?????? ??? ?? ???? ?????:  - ?? ????  ?????  ??? ??? ????? ????? ? ?? ???? ??? ?????? ?? ????? ???? ?????. ?? ??? ?? ?? ?? ????? ???? ?????? ??? ?? ???? ?? ?? ?? ???? ????.   ???????? XR 500 ???? ??? ??????  ?? 3 ??? ???? ?????? ?????? ?????? ?????? ??? ????? ??????? ? ?????? ????? ???? ?? ???????????? ?? (??????? ??) ??? ????   ????? ??? ???????  - ??? ?? ????? ???? ?? ????? ??? ?? ???? ????? ?????? ??????. ??? ???? ????? ?? ???? ???? ?? ???? ?? ???? ???? ?? ??? ????? ?? ?? ????? ??? ???.   ?? ?????  ???? ?? ??????????? ?? ?? ??????? ??? ???? ??????? ????? ?? ?? ??? ???? ????? ???? ??? ???? ???? ????? ?? ???? MyChart ?? ?? ???? ???? ??? ????? ????? ???.  ??? ??? ?? ???? ????????? ??? ??? ?? 2 ???? ????? ???? ????? ????? ?? ?? ????? ????.

## 2023-08-23 LAB — LIPID PANEL
Chol/HDL Ratio: 6.1 ratio — ABNORMAL HIGH (ref 0.0–5.0)
Cholesterol, Total: 233 mg/dL — ABNORMAL HIGH (ref 100–199)
HDL: 38 mg/dL — ABNORMAL LOW (ref >39)
LDL Chol Calc (NIH): 130 mg/dL — ABNORMAL HIGH (ref 0–99)
Triglycerides: 365 mg/dL — ABNORMAL HIGH (ref 0–149)
VLDL Cholesterol Cal: 65 mg/dL — ABNORMAL HIGH (ref 5–40)

## 2023-08-23 LAB — URINALYSIS
Bilirubin, UA: NEGATIVE
Glucose, UA: NEGATIVE
Ketones, UA: NEGATIVE
Nitrite, UA: NEGATIVE
Protein,UA: NEGATIVE
RBC, UA: NEGATIVE
Specific Gravity, UA: 1.023 (ref 1.005–1.030)
Urobilinogen, Ur: 0.2 mg/dL (ref 0.2–1.0)
pH, UA: 6 (ref 5.0–7.5)

## 2023-08-29 ENCOUNTER — Encounter: Payer: Self-pay | Admitting: Student

## 2023-08-29 ENCOUNTER — Other Ambulatory Visit: Payer: Self-pay | Admitting: Student

## 2023-08-29 MED ORDER — CEPHALEXIN 500 MG PO CAPS: 500.0000 mg | ORAL_CAPSULE | Freq: Two times a day (BID) | ORAL | 0 refills | Status: AC

## 2023-08-29 NOTE — Progress Notes (Signed)
Will treat for UTI

## 2023-11-28 ENCOUNTER — Ambulatory Visit (INDEPENDENT_AMBULATORY_CARE_PROVIDER_SITE_OTHER): Payer: Medicaid Other | Admitting: Student

## 2023-11-28 VITALS — BP 122/78 | HR 83 | Wt 215.8 lb

## 2023-11-28 DIAGNOSIS — R7303 Prediabetes: Secondary | ICD-10-CM

## 2023-11-28 DIAGNOSIS — E119 Type 2 diabetes mellitus without complications: Secondary | ICD-10-CM

## 2023-11-28 LAB — POCT GLYCOSYLATED HEMOGLOBIN (HGB A1C): HbA1c, POC (controlled diabetic range): 6.3 % (ref 0.0–7.0)

## 2023-11-28 MED ORDER — METFORMIN HCL ER (MOD) 1000 MG PO TB24: 1000.0000 mg | ORAL_TABLET | Freq: Every day | ORAL | 2 refills | Status: DC

## 2023-11-28 NOTE — Progress Notes (Addendum)
  SUBJECTIVE:   CHIEF COMPLAINT / HPI:   DM2 Meds: Metformin 500 XR   Was taking the medicine but ran out 4 days ago. No SE from medicine. Has been reducing amount of carbs, and sweet beverages. A1c 6.3 today (6.5)  PERTINENT  PMH / PSH:    OBJECTIVE:  BP 122/78   Pulse 83   Wt 215 lb 12.8 oz (97.9 kg)   SpO2 97%   BMI 32.81 kg/m  Physical Exam Constitutional:      General: He is not in acute distress.    Appearance: Normal appearance. He is not ill-appearing.  Cardiovascular:     Rate and Rhythm: Normal rate and regular rhythm.     Pulses: Normal pulses.     Heart sounds: Normal heart sounds. No murmur heard.    No friction rub. No gallop.  Pulmonary:     Effort: Pulmonary effort is normal. No respiratory distress.     Breath sounds: Normal breath sounds. No stridor. No wheezing, rhonchi or rales.  Abdominal:     General: There is no distension.     Palpations: Abdomen is soft. There is no mass.     Tenderness: There is no abdominal tenderness. There is no guarding or rebound.     Hernia: No hernia is present.  Neurological:     Mental Status: He is alert.      ASSESSMENT/PLAN:   Assessment & Plan Diabetes mellitus without complication [facility]) Patient comes in for follow-up of his prediabetes.  Patient has no complaints.  Patient reports he has been cutting back on carbohydrates, and sweet beverages.  Patient also notes he is exercising 1-2 times a week.  Patient notes he is tolerating metformin well without side effects.  Patient's A1c improved to 6.3, previously 6.5.  Will increase dose of metformin for better A1c control. - 1000 mg metformin daily - Follow-up 3 months No follow-ups on file. Bess Kinds, MD 11/28/2023, 6:16 PM PGY-3, [facility]

## 2023-11-28 NOTE — Patient Instructions (Addendum)
It was great to see you! Thank you for allowing me to participate in your care!  I recommend that you always bring your medications to each appointment as this makes it easy to ensure we are on the correct medications and helps [place] not miss when refills are needed.  Our plans for today:  - Prediabetes Your A1c was 6.3 today, this is lower than before, when it was 6.5. We still want to get it lower! We are going to increase the dose of your metformin to 1000 mg daily.    Make a follow up appointment in 3 months (around May 14th)  Take care and seek immediate care sooner if you develop any concerns.   Dr. Bess Kinds, MD [facility]   ???? ??? ???? ???! ???? ?? ????? ?? ?? ????? ????? ?? ?? ?????? ??? ?????? ???!  ?? ????? ????? ?? ??? ????? ????? ??? ??? ?? ?? ?? ?????? ??????? ???? ??? ??? ????? ??? ?? ????? ?? ????? ??? ???? ?? ???? ?????? ???? ?????? ? ?? ?? ??? ????? ?? ??????? ???? ?? ?? ???? ???? ???? ?? ??? ?????.  ?????? ??? ?? ???? ?????:  - ??? ????? ????? A1c ??? 6.3 ???? ??? ???? ?? ??? ???? ??????? 6.5 ???. ?? ???? ?? ???????? ???? ????? ???????! ?? ??? ????? ??? ????????? ??? ?? ?? 1000 ???? ??? ?????? ?????? ????.    ?? ???? ?????? ?????? ?? ?? 3 ??? (???? 14 ??) ????? ????  ????? ????? ? ??? ??? ?? ??? ?????? ?? ????? ??????? ????? ?? ????? ?????? ???? ?????.   ????? ?????? ????? MD ????? ???????? ?????

## 2023-11-28 NOTE — Assessment & Plan Note (Deleted)
Patient comes in for follow-up of his prediabetes.  Patient has no complaints.  Patient reports he has been cutting back on carbohydrates, and sweet beverages.  Patient also notes he is exercising 1-2 times a week.  Patient notes he is tolerating metformin well without side effects.  Patient's A1c improved to 6.3, previously 6.5.  Will increase dose of metformin for better A1c control. - 1000 mg metformin daily - Follow-up 3 months

## 2023-11-28 NOTE — Assessment & Plan Note (Signed)
Patient comes in for follow-up of his prediabetes.  Patient has no complaints.  Patient reports he has been cutting back on carbohydrates, and sweet beverages.  Patient also notes he is exercising 1-2 times a week.  Patient notes he is tolerating metformin well without side effects.  Patient's A1c improved to 6.3, previously 6.5.  Will increase dose of metformin for better A1c control. - 1000 mg metformin daily - Follow-up 3 months

## 2023-12-01 ENCOUNTER — Other Ambulatory Visit (HOSPITAL_COMMUNITY): Payer: Self-pay

## 2023-12-02 ENCOUNTER — Other Ambulatory Visit: Payer: Self-pay | Admitting: Family Medicine

## 2023-12-02 ENCOUNTER — Other Ambulatory Visit (HOSPITAL_COMMUNITY): Payer: Self-pay

## 2023-12-02 MED ORDER — METFORMIN HCL ER 500 MG PO TB24
1000.0000 mg | ORAL_TABLET | Freq: Two times a day (BID) | ORAL | 3 refills | Status: DC
  Filled 2023-12-02: qty 360, 90d supply, fill #0

## 2023-12-03 ENCOUNTER — Other Ambulatory Visit (HOSPITAL_COMMUNITY): Payer: Self-pay

## 2023-12-05 ENCOUNTER — Ambulatory Visit: Payer: Self-pay | Admitting: Student

## 2023-12-18 ENCOUNTER — Other Ambulatory Visit (HOSPITAL_COMMUNITY): Payer: Self-pay

## 2023-12-18 ENCOUNTER — Other Ambulatory Visit: Payer: Self-pay | Admitting: Student

## 2023-12-18 ENCOUNTER — Telehealth: Payer: Self-pay | Admitting: Student

## 2023-12-18 MED ORDER — METFORMIN HCL ER 500 MG PO TB24
1000.0000 mg | ORAL_TABLET | Freq: Two times a day (BID) | ORAL | 3 refills | Status: DC
  Filled 2023-12-18: qty 360, 90d supply, fill #0

## 2023-12-18 NOTE — Telephone Encounter (Signed)
 Called to discuss metformin medication with patient. I had received an email that patient's meds needed to be sent to a different pharmacy. I sent medicine to the correct pharmacy, however patient let me know that he already picked up the meds and is taking them. 1000 mg q am/pm  Also discussed that I miss-spoke at initial appointment for diabetes screening. He does have diabetes, not prediabetes, but his A1c is good and it's well controlled right now.   Discussed that he will want to follow up in 3 months.

## 2023-12-18 NOTE — Progress Notes (Signed)
 Called to discuss medication with patient. Patient reports he has gotten his metformin and is taking 1000 mg q am/pm.

## 2024-03-05 ENCOUNTER — Ambulatory Visit (INDEPENDENT_AMBULATORY_CARE_PROVIDER_SITE_OTHER): Admitting: Student

## 2024-03-05 ENCOUNTER — Ambulatory Visit: Payer: Self-pay | Admitting: Student

## 2024-03-05 ENCOUNTER — Encounter: Payer: Self-pay | Admitting: Student

## 2024-03-05 VITALS — BP 125/89 | HR 72 | Wt 212.2 lb

## 2024-03-05 DIAGNOSIS — E119 Type 2 diabetes mellitus without complications: Secondary | ICD-10-CM | POA: Diagnosis not present

## 2024-03-05 LAB — POCT GLYCOSYLATED HEMOGLOBIN (HGB A1C): HbA1c, POC (controlled diabetic range): 6.1 % (ref 0.0–7.0)

## 2024-03-05 MED ORDER — METFORMIN HCL ER 500 MG PO TB24: 1000.0000 mg | ORAL_TABLET | Freq: Two times a day (BID) | ORAL | 3 refills | Status: DC

## 2024-03-05 NOTE — Patient Instructions (Addendum)
 It was great to see you! Thank you for allowing me to participate in your care!  I recommend that you always bring your medications to each appointment as this makes it easy to ensure we are on the correct medications and helps us  not miss when refills are needed.  Our plans for today:  - Diabetes  Continue Metformin  1000 mig twice a day (am and pm) Make follow up appointment in 6 months, (you can be seen in 3 months if you would like to meet your new provider.)  Checking some blood work for kidney function  We are sending you to the [facility] (Opthalmology). Make appointment with them to get your eyes examined   Take care and seek immediate care sooner if you develop any concerns.   Dr. Wilhemena Harbour, MD [facility]   ?? ??   ???? ??? ???? ???! ???? ?? ????? ?? ?? ????? ????? ?? ?? ?????? ??? ?????? ???!  ?? ????? ????? ?? ??? ????? ????? ??? ??? ?? ?? ?? ?????? ??????? ???? ??? ??? ????? ??? ?? ????? ?? ????? ??? ???? ?? ???? ?????? ???? ?????? ? ?? ?? ??? ????? ?? ??????? ???? ?? ?? ???? ???? ???? ?? ??? ?????.  ?????? ??? ?? ???? ?????:  - ?????  ???????? 1000 ???? ??? ?? ?? ??? ?? ??? (??? ? ??? ?? ???) ????? ???? ?? 6 ??? ???? ?????? ?????? ????? (??? ???????? ?? ????? ????? ???? ??? ?????? ????? ??? ???????? ?? 3 ??? ???? ????.)  ?? ???? ???? ?? ?????? ??? ???? ?????? ????  ?? ??? ?? ?? ????? ??? (??? ?????) ????????. ?? ???? ???? ?????? ?? ????? ??? ?????? ???   ????? ????? ? ??? ??? ?? ??? ?????? ?? ????? ??????? ????? ?? ????? ?????? ???? ?????.   ????? ?????? ????? MD ????? ???????? ?????

## 2024-03-05 NOTE — Assessment & Plan Note (Addendum)
 Patient comes in for follow-up of his diabetes.  Patient reports good compliance with medication taking metformin  twice daily 1000 mg.  Patient A1c improved today to 6.1 from 6.3.  Patient reporting good diet and exercise.  Patient A1c at goal, will recommend follow-up in 6 months. - Continue metformin  1000 mg twice daily - Follow-up 6 months - Encourage routine exercise and good diet - Referral to ophthalmology for eye exam - Urine microalbumin/creatinine - BMP

## 2024-03-05 NOTE — Progress Notes (Signed)
  SUBJECTIVE:   CHIEF COMPLAINT / HPI:   Diabetes Meds: Metformin  1000 mg BID  Diet, trying not to eat sweets, and is watching his diet, does still eat carbs and rice but tries to limit them.    Exercise: Goes to gym 2-3 times a week.   PERTINENT  PMH / PSH:   OBJECTIVE:  BP 125/89   Pulse 72   Wt 212 lb 3.2 oz (96.3 kg)   SpO2 99%   BMI 32.26 kg/m  Physical Exam Constitutional:      General: He is not in acute distress.    Appearance: Normal appearance. He is not ill-appearing.  Cardiovascular:     Rate and Rhythm: Normal rate and regular rhythm.     Pulses: Normal pulses.     Heart sounds: Normal heart sounds. No murmur heard.    No friction rub. No gallop.  Pulmonary:     Effort: Pulmonary effort is normal. No respiratory distress.     Breath sounds: Normal breath sounds. No stridor. No wheezing, rhonchi or rales.  Abdominal:     General: There is no distension.     Palpations: Abdomen is soft. There is no mass.     Tenderness: There is no abdominal tenderness. There is no guarding or rebound.     Hernia: No hernia is present.  Neurological:     Mental Status: He is alert.  Psychiatric:        Mood and Affect: Mood normal.        Behavior: Behavior normal.      ASSESSMENT/PLAN:   Assessment & Plan Diabetes mellitus without complication [facility]) Patient comes in for follow-up of his diabetes.  Patient reports good compliance with medication taking metformin  twice daily 1000 mg.  Patient A1c improved today to 6.1 from 6.3.  Patient reporting good diet and exercise.  Patient A1c at goal, will recommend follow-up in 6 months. - Continue metformin  1000 mg twice daily - Follow-up 6 months - Encourage routine exercise and good diet - Referral to ophthalmology for eye exam - Urine microalbumin/creatinine - BMP No follow-ups on file. Wilhemena Harbour, MD 03/05/2024, 1:39 PM PGY-3, [facility]

## 2024-03-06 LAB — BASIC METABOLIC PANEL WITH GFR
BUN/Creatinine Ratio: 20 (ref 9–20)
BUN: 15 mg/dL (ref 6–24)
CO2: 19 mmol/L — ABNORMAL LOW (ref 20–29)
Calcium: 9.7 mg/dL (ref 8.7–10.2)
Chloride: 104 mmol/L (ref 96–106)
Creatinine, Ser: 0.75 mg/dL — ABNORMAL LOW (ref 0.76–1.27)
Glucose: 86 mg/dL (ref 70–99)
Potassium: 4.4 mmol/L (ref 3.5–5.2)
Sodium: 140 mmol/L (ref 134–144)
eGFR: 116 mL/min/{1.73_m2} (ref >59)

## 2024-03-06 LAB — MICROALBUMIN / CREATININE URINE RATIO
Creatinine, Urine: 132.5 mg/dL
Microalb/Creat Ratio: 9 mg/g{creat} (ref 0–29)
Microalbumin, Urine: 12.4 ug/mL

## 2024-04-06 ENCOUNTER — Ambulatory Visit (HOSPITAL_COMMUNITY)
Admission: EM | Admit: 2024-04-06 | Discharge: 2024-04-06 | Disposition: A | Discharge status: Home / Self Care | Attending: Internal Medicine | Admitting: Internal Medicine

## 2024-04-06 ENCOUNTER — Encounter (HOSPITAL_COMMUNITY): Payer: Self-pay

## 2024-04-06 DIAGNOSIS — K047 Periapical abscess without sinus: Secondary | ICD-10-CM | POA: Diagnosis not present

## 2024-04-06 HISTORY — DX: Type 2 diabetes mellitus without complications: E11.9

## 2024-04-06 MED ORDER — CEFTRIAXONE SODIUM 1 G IJ SOLR
INTRAMUSCULAR | Status: AC
  Filled 2024-04-06: qty 10

## 2024-04-06 MED ORDER — LIDOCAINE HCL (PF) 1 % IJ SOLN
INTRAMUSCULAR | Status: AC
  Filled 2024-04-06: qty 2

## 2024-04-06 MED ORDER — CEFTRIAXONE SODIUM 1 G IJ SOLR
1.0000 g | Freq: Once | INTRAMUSCULAR | Status: AC
  Administered 2024-04-06: 1 g via INTRAMUSCULAR

## 2024-04-06 MED ORDER — AMOXICILLIN-POT CLAVULANATE 875-125 MG PO TABS: 1.0000 | ORAL_TABLET | Freq: Two times a day (BID) | ORAL | 0 refills | Status: AC

## 2024-04-06 NOTE — ED Triage Notes (Signed)
 Patient c/o right upper dental pain and facial swelling since this AM.  Patient has had tylenol for pain.

## 2024-04-06 NOTE — ED Provider Notes (Signed)
 [facility]    CSN: [id number] Arrival date & time: 04/06/24  1946      History   Chief Complaint Chief Complaint  Patient presents with   Dental Pain    HPI Duane Rios is a 43 y.o. male.   43 year old male who presents to urgent care with complaints of right upper jaw pain, facial swelling and facial pain.  This started today.  He denies any fevers or chills.  He reports that it seems to be getting worse throughout the day.  He has had problems with his teeth in the past.   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 No pertinent surgical history.     Home Medications    Prior to Admission medications   Medication Sig Start Date End Date Taking? Authorizing Provider  amoxicillin-clavulanate (AUGMENTIN) 875-125 MG tablet Take 1 tablet by mouth every 12 (twelve) hours for 10 days. 04/06/24 04/16/24 Yes Jacques Willingham A, PA-C  fluticasone  (FLONASE ) 50 MCG/ACT nasal spray Place 2 sprays into both nostrils daily. 02/06/21   Mullis, Kiersten P, DO  metFORMIN  (GLUCOPHAGE -XR) 500 MG 24 hr tablet Take 2 tablets (1,000 mg total) by mouth 2 (two) times daily with a meal. 03/05/24   Jennelle Riis, MD    Family History Family History  Problem Relation Age of Onset   Hypertension Mother     Social History Social History   Tobacco Use    Smoking status: Never   Smokeless tobacco: Never  Vaping Use   Vaping status: Never Used  Substance Use Topics   Alcohol use: Never   Drug use: Never     Allergies   Patient has no known allergies.   Review of Systems Review of Systems  Constitutional:  Negative for chills and fever.  HENT:  Positive for dental problem. Negative for ear pain and sore throat.   Eyes:  Negative for pain and visual disturbance.  Respiratory:  Negative for cough and shortness of breath.   Cardiovascular:  Negative for chest pain and palpitations.  Gastrointestinal:  Negative for abdominal pain and vomiting.  Genitourinary:  Negative for dysuria and hematuria.  Musculoskeletal:  Negative for arthralgias and back pain.  Skin:  Negative for color change and rash.  Neurological:  Negative for seizures and syncope.  All other systems reviewed and are negative.    Physical Exam Triage Vital Signs ED Triage Vitals  Encounter Vitals Group     BP 04/06/24 1953 132/81     Girls Systolic BP Percentile --      Girls Diastolic BP Percentile --      Boys Systolic BP Percentile --      Boys Diastolic BP Percentile --      Pulse Rate 04/06/24 1953 95     Resp 04/06/24 1953 16     Temp  04/06/24 1953 (!) 97.5 F (36.4 C)     Temp Source 04/06/24 1953 Oral     SpO2 04/06/24 1953 95 %     Weight --      Height --      Head Circumference --      Peak Flow --      Pain Score 04/06/24 1955 5     Pain Loc --      Pain Education --      Exclude from Growth Chart --    No data found.  Updated Vital Signs BP 132/81 (BP Location: Right Arm)   Pulse 95   Temp (!) 97.5 F (36.4 C) (Oral)   Resp 16   SpO2 95%   Visual Acuity Right Eye Distance:   Left Eye Distance:   Bilateral Distance:    Right Eye Near:   Left Eye Near:    Bilateral Near:     Physical Exam Vitals and nursing note reviewed.  Constitutional:      General: He is not in acute distress.    Appearance: He is well-developed.   HENT:     Head: Normocephalic and atraumatic.      Mouth/Throat:    Eyes:     Conjunctiva/sclera: Conjunctivae normal.    Cardiovascular:     Rate and Rhythm: Normal rate and regular rhythm.     Heart sounds: No murmur heard. Pulmonary:     Effort: Pulmonary effort is normal. No respiratory distress.     Breath sounds: Normal breath sounds.  Abdominal:     Palpations: Abdomen is soft.     Tenderness: There is no abdominal tenderness.   Musculoskeletal:        General: No swelling.     Cervical back: Neck supple.   Skin:    General: Skin is warm and dry.     Capillary Refill: Capillary refill takes less than 2 seconds.   Neurological:     Mental Status: He is alert.   Psychiatric:        Mood and Affect: Mood normal.      UC Treatments / Results  Labs (all labs ordered are listed, but only abnormal results are displayed) Labs Reviewed - No data to display  EKG   Radiology No results found.  Procedures Procedures (including critical care time)  Medications Ordered in UC Medications  cefTRIAXone (ROCEPHIN) injection 1 g (1 g Intramuscular Given 04/06/24 2005)    Initial Impression / Assessment and Plan / UC Course  I have reviewed the triage vital signs and the nursing notes.  Pertinent labs & imaging results that were available during my care of the patient were reviewed by me and considered in my medical decision making (see chart for details).     Dental abscess   Right upper/posterior molar infection with abscess.  This will require antibiotics for treatment.  Since you are unable to pick up the antibiotics tonight we will give an injection of Rocephin (ceftriaxone) which is an antibiotic.  We will then treat with antibiotics by mouth.  We will treat with the following: Ceftriaxone (rocephin) injection given today. This is an antibiotic.  Augmentin 875/125 mg twice daily for 10 days.  This is an antibiotic by mouth.  Take this with food. May use  ibuprofen or Tylenol for pain. May need to consider following up with a dentist for further evaluation Return to urgent care or PCP if symptoms worsen or fail to resolve.  Final Clinical Impressions(s) / UC Diagnoses   Final diagnoses:  Dental abscess     Discharge Instructions      Right upper/posterior molar infection with abscess.  This will require antibiotics for treatment.  Since you are unable to pick up the antibiotics tonight we will give an injection of Rocephin (ceftriaxone) which is an antibiotic.  We will then treat with antibiotics by mouth.  We will treat with the following: Ceftriaxone (rocephin) injection given today. This is an antibiotic.  Augmentin 875/125 mg twice daily for 10 days.  This is an antibiotic by mouth.  Take this with food. May use ibuprofen or Tylenol for pain. May need to consider following up with a dentist for further evaluation Return to urgent care or PCP if symptoms worsen or fail to resolve.        ED Prescriptions     Medication Sig Dispense Auth. Provider   amoxicillin-clavulanate (AUGMENTIN) 875-125 MG tablet Take 1 tablet by mouth every 12 (twelve) hours for 10 days. 20 tablet Teresa Almarie LABOR, [place]      PDMP not reviewed this encounter.   Teresa Almarie LABOR [place] 04/06/24 2009

## 2024-04-06 NOTE — Discharge Instructions (Addendum)
 Right upper/posterior molar infection with abscess.  This will require antibiotics for treatment.  Since you are unable to pick up the antibiotics tonight we will give an injection of Rocephin (ceftriaxone) which is an antibiotic.  We will then treat with antibiotics by mouth.  We will treat with the following: Ceftriaxone (rocephin) injection given today. This is an antibiotic.  Augmentin 875/125 mg twice daily for 10 days.  This is an antibiotic by mouth.  Take this with food. May use ibuprofen or Tylenol for pain. May need to consider following up with a dentist for further evaluation Return to urgent care or PCP if symptoms worsen or fail to resolve.

## 2024-07-16 ENCOUNTER — Ambulatory Visit (INDEPENDENT_AMBULATORY_CARE_PROVIDER_SITE_OTHER): Admitting: Family Medicine

## 2024-07-16 ENCOUNTER — Encounter: Payer: Self-pay | Admitting: Family Medicine

## 2024-07-16 VITALS — BP 139/94 | HR 72 | Ht 68.0 in | Wt 213.4 lb

## 2024-07-16 DIAGNOSIS — Z23 Encounter for immunization: Secondary | ICD-10-CM

## 2024-07-16 DIAGNOSIS — E119 Type 2 diabetes mellitus without complications: Secondary | ICD-10-CM | POA: Diagnosis present

## 2024-07-16 LAB — POCT GLYCOSYLATED HEMOGLOBIN (HGB A1C): HbA1c, POC (controlled diabetic range): 6.2 % (ref 0.0–7.0)

## 2024-07-16 MED ORDER — LOSARTAN POTASSIUM 25 MG PO TABS: 25.0000 mg | ORAL_TABLET | Freq: Every day | ORAL | 5 refills | Status: AC

## 2024-07-16 MED ORDER — METFORMIN HCL ER 500 MG PO TB24: 1000.0000 mg | ORAL_TABLET | Freq: Two times a day (BID) | ORAL | 3 refills | Status: DC

## 2024-07-16 NOTE — Assessment & Plan Note (Addendum)
 A1c well controlled today at 6.2. BP remains elevated on repeat with hx of elevated BP per chart review.  - Continue metformin  1000 BID - Start Losartan 25 daily - discussed cessation if lightheaded/dizzy

## 2024-07-16 NOTE — Progress Notes (Signed)
    SUBJECTIVE:   CHIEF COMPLAINT / HPI:   T2DM - Current regimen: Metformin  1000 BID  -No changes in appetite -Has decreased rice, bread intake  -Occasional soda/juice  -Exercises: goes to gym 3-4 per week   PERTINENT  PMH / PSH: T2DM  OBJECTIVE:   BP (!) 139/94   Pulse 72   Ht 5' 8 (1.727 m)   Wt 213 lb 6 oz (96.8 kg)   SpO2 100%   BMI 32.44 kg/m   General: Well-appearing. Resting comfortably in room. CV: Normal S1/S2. No extra heart sounds. Warm and well-perfused. Pulm: Breathing comfortably on room air. CTAB. No increased WOB. Abd: Soft, non-tender, non-distended. Skin:  Warm, dry. Psych: Pleasant and appropriate.    ASSESSMENT/PLAN:   Assessment & Plan Diabetes mellitus without complication (HCC) A1c well controlled today at 6.2. BP remains elevated on repeat with hx of elevated BP per chart review.  - Continue metformin  1000 BID - Start Losartan 25 daily - discussed cessation if lightheaded/dizzy Encounter for immunization Received annual flu vaccine today.    RTC in 2 weeks for BP follow up. Will check BMP and lipid panel at that time. Follow up scheduled.  Future Appointments  Date Time Provider Department Center  07/30/2024  1:50 PM Diona Perkins, MD [facility]     Perkins Diona, MD [facility]

## 2024-07-16 NOTE — Patient Instructions (Addendum)
???? ?? ????? ????? ?? ?????? ???? ????? ? ?? ?? ????? ????? ?? ?? ?????? ??? ?????? ????!    A1c ??? ????? ??? ?? ??? ?????. ????? ???? ????????? ?? ??????? ?? ????? ??????? ????? ????. ?? ????? ??? ??????? ??? ?? ?? ?????? ? ??? ?? ?? ??? ?????? ???.   ?? ????? ?? ????? ???? ?? ?? ??? ???????? ???? ?????. ??? ??? ????? ?? ???? ??? ??? ??????? ??? ????? ????? ?????? ???? ? ?? ???? ??? ??? ?????. ??? ??? ?? ???? ???? ??? ??? ????? ?????? ?? ?????? ??????? ????? ???? ???? ????? ???? ? ?? ?? ????? ????.   ????? ???? ????? ???? ?????? ??? ?? ??? ???? ??????:  ???? ??? ??????? ?????? ???? ??? ????? - ??? ???? [id number] ?[address] ?????[address] ????????? [place] [id number] [phone]  ????? ???? ?????? ?? ???? ?????? ?????? ?? ?? 2 ???? ?? ?? ????? ????.   ?? ???? ?? ?? ???? ?? ?????? ?? ????? ?? ?? ???? ?????? - ?? ????? ???? ??? ?????!  ????? ????? MD ???? ?? ??????? ??? [phone]  _______  Thank you for visiting clinic today and allowing us  to participate in your care!  Your A1c looks good today. Please continue taking metformin  as you have been. We are checking your cholesterol levels today and will update you.   We started a new medication today called Losartan. This helps protect your kidneys when you have diabetes and helps with blood pressure. If you feel faint or dizzy when taking this medication, please stop taking it and let us  know.   Please call below to schedule an eye exam:  [facility] [address] [place], [place] [address] [phone]  Please schedule a clinic appointment with us  in 2 weeks for follow up.   Reach out any time with any questions or concerns you may have - we are here for you!  Damien Cassis, MD [facility] [phone]

## 2024-07-29 NOTE — Progress Notes (Signed)
    SUBJECTIVE:   CHIEF COMPLAINT / HPI:   Blood pressure -Patient presenting today for follow up on BP, was started losartan 25 at last visit -Reports doing well  -Reports no dizziness, lightheadedness, presyncope -No CP, SOB, HA, VC  PERTINENT  PMH / PSH: T2DM, elevated BP  OBJECTIVE:   BP 118/80   Pulse 82   Wt 212 lb (96.2 kg)   SpO2 98%   BMI 32.23 kg/m   General: Well-appearing. Resting comfortably in room. CV: Normal S1/S2. No extra heart sounds. Warm and well-perfused. Pulm: Breathing comfortably on room air. CTAB. No increased WOB. Abd: Soft, non-tender, non-distended. Skin:  Warm, dry. Psych: Pleasant and appropriate.    ASSESSMENT/PLAN:   Assessment & Plan Diabetes mellitus without complication (HCC) Elevated BP without diagnosis of hypertension BP well controlled in office today. Annual diabetes workup recently completed and unremarkable. A1c well controlled at 6.2 earlier this month.  - BMP, lipid panel today  - Continue losartan 25 daily - Continue metformin  1000 BID   RTC in 3-4 months for follow up.   Patient seen with medical student Dotty Blanch. Greatly appreciate his care of patient.   Damien Cassis, MD [facility]

## 2024-07-30 ENCOUNTER — Ambulatory Visit (INDEPENDENT_AMBULATORY_CARE_PROVIDER_SITE_OTHER): Payer: Self-pay | Admitting: Family Medicine

## 2024-07-30 ENCOUNTER — Encounter: Payer: Self-pay | Admitting: Family Medicine

## 2024-07-30 VITALS — BP 118/80 | HR 82 | Wt 212.0 lb

## 2024-07-30 DIAGNOSIS — R030 Elevated blood-pressure reading, without diagnosis of hypertension: Secondary | ICD-10-CM

## 2024-07-30 DIAGNOSIS — E119 Type 2 diabetes mellitus without complications: Secondary | ICD-10-CM | POA: Diagnosis not present

## 2024-07-30 NOTE — Assessment & Plan Note (Signed)
 BP well controlled in office today. Annual diabetes workup recently completed and unremarkable. A1c well controlled at 6.2 earlier this month.  - BMP, lipid panel today  - Continue losartan 25 daily - Continue metformin  1000 BID

## 2024-07-30 NOTE — Patient Instructions (Addendum)
?? ??? ?? ????? ?? ?????? ?????? ????? ? ????? ????? ?? ?????? ??? ?????? ???? ??????????!???? ??? ??? ????? ????? ??? ?? ??? ?????. ????? ??????? ??? ?? ??? ????? ????? ????. ?? ????? ?? ????? ????????? ??? ???? ????? ???? ? ?? ??? ????? ????? ??? ?? ??? ???? ?? ???????? ??? ?? ???.????? ?? ??? ?????? ?? ? ?? ? ??? ????? ???? ?????? ????? ????? ????.?? ???? ?? ???? ?? ?????? ????? ?? ?? ???? ?????? - ?? ????? ????? ?? ?? ??? ????? ????!????? ????? ???????? ????? ??????? ??????-???-????     ______  Thank you for visiting clinic today and allowing us  to participate in your care!  Your blood pressure looks great today. Please continue taking your medications as prescribed. I will contact you with the results of your labs today and let you know if we need to make any changes.   Please schedule an appointment in 3-4 months for routine follow up.   Reach out any time with any questions or concerns you may have - we are here for you!  Damien Cassis, MD [facility] [phone]

## 2024-07-31 ENCOUNTER — Ambulatory Visit: Payer: Self-pay | Admitting: Family Medicine

## 2024-07-31 LAB — BASIC METABOLIC PANEL WITH GFR
BUN/Creatinine Ratio: 15 (ref 9–20)
BUN: 12 mg/dL (ref 6–24)
CO2: 22 mmol/L (ref 20–29)
Calcium: 9.6 mg/dL (ref 8.7–10.2)
Chloride: 100 mmol/L (ref 96–106)
Creatinine, Ser: 0.78 mg/dL (ref 0.76–1.27)
Glucose: 81 mg/dL (ref 70–99)
Potassium: 4.2 mmol/L (ref 3.5–5.2)
Sodium: 136 mmol/L (ref 134–144)
eGFR: 115 mL/min/1.73 (ref >59)

## 2024-07-31 LAB — LIPID PANEL
Chol/HDL Ratio: 6.2 ratio — ABNORMAL HIGH (ref 0.0–5.0)
Cholesterol, Total: 217 mg/dL — ABNORMAL HIGH (ref 100–199)
HDL: 35 mg/dL — ABNORMAL LOW (ref >39)
LDL Chol Calc (NIH): 123 mg/dL — ABNORMAL HIGH (ref 0–99)
Triglycerides: 335 mg/dL — ABNORMAL HIGH (ref 0–149)
VLDL Cholesterol Cal: 59 mg/dL — ABNORMAL HIGH (ref 5–40)

## 2024-07-31 MED ORDER — ATORVASTATIN CALCIUM 20 MG PO TABS: 20.0000 mg | ORAL_TABLET | Freq: Every day | ORAL | 3 refills | Status: AC

## 2024-11-05 ENCOUNTER — Ambulatory Visit: Payer: Self-pay

## 2024-11-05 VITALS — BP 128/80 | HR 81 | Ht 68.0 in | Wt 210.4 lb

## 2024-11-05 DIAGNOSIS — E119 Type 2 diabetes mellitus without complications: Secondary | ICD-10-CM | POA: Diagnosis present

## 2024-11-05 DIAGNOSIS — R030 Elevated blood-pressure reading, without diagnosis of hypertension: Secondary | ICD-10-CM

## 2024-11-05 DIAGNOSIS — K5904 Chronic idiopathic constipation: Secondary | ICD-10-CM

## 2024-11-05 DIAGNOSIS — E1169 Type 2 diabetes mellitus with other specified complication: Secondary | ICD-10-CM

## 2024-11-05 DIAGNOSIS — E785 Hyperlipidemia, unspecified: Secondary | ICD-10-CM

## 2024-11-05 LAB — POCT GLYCOSYLATED HEMOGLOBIN (HGB A1C): HbA1c, POC (controlled diabetic range): 6 % (ref 0.0–7.0)

## 2024-11-05 MED ORDER — METFORMIN HCL ER 500 MG PO TB24: 1000.0000 mg | ORAL_TABLET | Freq: Two times a day (BID) | ORAL | 3 refills | Status: AC

## 2024-11-05 MED ORDER — BLOOD GLUCOSE MONITORING SUPPL DEVI: 1.0000 | Freq: Three times a day (TID) | 0 refills | Status: AC

## 2024-11-05 MED ORDER — LANCETS MISC. MISC: 1.0000 | Freq: Three times a day (TID) | 0 refills | Status: AC

## 2024-11-05 MED ORDER — POLYETHYLENE GLYCOL 3350 17 GM/SCOOP PO POWD: 17.0000 g | Freq: Two times a day (BID) | ORAL | 1 refills | Status: AC | PRN

## 2024-11-05 MED ORDER — BLOOD GLUCOSE TEST VI STRP: 1.0000 | ORAL_STRIP | Freq: Three times a day (TID) | 0 refills | Status: AC

## 2024-11-05 MED ORDER — LANCET DEVICE MISC: 1.0000 | Freq: Three times a day (TID) | 0 refills | Status: AC

## 2024-11-05 NOTE — Progress Notes (Addendum)
" ° ° °  SUBJECTIVE:   CHIEF COMPLAINT / HPI: Presents with in person interpreter  T2DM: Patient was confused regarding whether he is a diabetic. Reports that he has not been recording blood sugar values at home.   HTN: Reports that he has not been taking the Losartan , and does not have a blood pressure monitor at home. Reports that they very rarely they eat fast food or pre packed foods at home. Does eat red meat at home.  Constipation: Reports that he has hard and small balls when he stools. Regular BM every day. 2-3 times over the last month he has had some bright red blood streaking on toilet paper when wiping.  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 Normal S1/S2. No murmurs, rubs, or gallops appreciated. Lungs: Clear bilaterally to ascultation.  Abdomen: Normoactive bowel sounds. No tenderness to deep or light palpation. No rebound or guarding.    Psych: Pleasant and appropriate    ASSESSMENT/PLAN:   Assessment & Plan Diabetes mellitus without complication (HCC) Well-controlled, A1c today 6.0, has remained well-controlled since initial diagnosis 08/2023.  Extensive diet education provided as mentioned below, patient does not eat much simple refined sugars and most sugar intake appears to be through carbohydrates.  Patient was open to monitoring blood sugar at home to assess better understanding of dietary Blood Sugar. Diabetic supplies ordered, instructed patient to monitor for 2 weeks fasting and 1 hour postprandial blood sugars.  Niece is currently monitoring blood sugar and will assist with education on use of supplies Continue metformin  1000 mg twice daily F/u in 3-5 months Hyperlipidemia associated with type 2 diabetes mellitus (HCC) Reports taking atorvastatin  daily still  has much improvement for dietary modifications eating lots of red meat and does eat mostly home prepared meals.  Provided nutritional education, patient not interested in referral for nutritional counseling at this time. Repeat lipid panel Continue atorvastatin  20 mg, can consider adjusting dose pending lipid panel results. Can consider switching to olmesartan-atorvastatin  combo pill if lipid panel is improved. Increase fiber intake and reduce red meat Chronic idiopathic constipation Ongoing issue, suspect secondary to diet given limited fiber and hydration.  Also endorsing bright red blood when wiping on 2-3 separate occasions over the last month, symptoms appear to be internal hemorrhoids vs rectal fissure, but will need proper rectal exam at follow-up visit. Nutrition counseling provided including good hydration. MiraLAX twice daily as needed Plan for rectal exam at follow up visit, if hemorrhoids or fissure not seen then will refer to GI Elevated BP without diagnosis of hypertension Previous clinic visit blood pressure was 139/94, today 129/80.  Patient reports that he has not been taking the losartan  and denies any adverse symptoms of hypertension.  His blood pressure was at goal today instructed patient to continue current lifestyle modifications and to acquire blood pressure monitor from friend or neighbor to record ambulatory BP at least once daily for 2 weeks. Ambulatory BP log for 2 weeks.  Will call patient to follow-up on log.     Fairy Amy, MD [facility] "

## 2024-11-05 NOTE — Assessment & Plan Note (Signed)
 Well-controlled, A1c today 6.0, has remained well-controlled since initial diagnosis 08/2023.  Extensive diet education provided as mentioned below, patient does not eat much simple refined sugars and most sugar intake appears to be through carbohydrates.  Patient was open to monitoring blood sugar at home to assess better understanding of dietary Blood Sugar. Diabetic supplies ordered, instructed patient to monitor for 2 weeks fasting and 1 hour postprandial blood sugars.  Niece is currently monitoring blood sugar and will assist with education on use of supplies Continue metformin  1000 mg twice daily F/u in 3-5 months

## 2024-11-05 NOTE — Patient Instructions (Addendum)
?? ????? ????? ?? ?????? ????? ??????? ?????? ??? ?? ??? ?? ????! ????? ????? ??????? ??????? ??? ?? ????? ????? ?????. ?? ????? ????? ?? ????? ??? ?? ????? ?????: ?????: ?????? ????? ???? ?? ????? ????? ??? ????????? ????? ?????   A1c ??? 6.0 ???. ?? ???? ????????? ?? ??? ??? ? ?? ????? ????. ?????? ?????? ? ????? ???? ???? ????? ??? ??? ??? ?? ???? ?? ??? ?? ???? ???? ??? ????? ??????. ????? ??? ??? ?? ????? ???? ?? ??? ??? ?? ????? ??? ????? ?????? ? ?? ???? ?? ?? ?? ???? ????? ????? ????. ???? ???: ???? ??? ??? ????? ????? ???? ????? ?? ?????? ???? ??? ?? ??????? ???? ???? ?? ?? ???????? ????? ?? ???? ??? ??? ?? ?? ???? ????? ????. ???? ??? ??? ?? ????? ???? ?? ??? ?? ???? ?? ??? ?? ??? ??????? ??? ?? ???? ??? ?? ??? ??? ????. ??????? ????: ?? ???? ???????????? ??? ????? ????? ????? ?? ?? ?????? ????? ???? ????? ??????? ??? ????? ????? ?????? ? ?? ?? ?????? ????? ?? ??? ???? ????? ????. ????? 3 ?? 4 ??? ????? ???? ?????? ?????? ????.   ???? ?????? ????? ????? ?? ???? ?? ????? [phone] ???? ?????? ?? ???? ?? ????? ?? MyChart ????? ????. ??? ???? ????? ?????!  - ???? ??????? ???? ?????? ????? ??????? [place]? ??? ???   Thank you for visiting the clinic today, it was good to see you!  Please always bring your medication bottles  In today's visit we discussed:  Diabetes: Great work on controlling your diabetes, today your A1c was 6.0.  Continue taking the metformin  2 tablets every morning and night.  I have also prescribed you some diabetes supplies to check your blood sugars for 2 weeks at home.  Please do this first thing in the morning when you wake up in 1 hour after each meal.  Hypertension: Your blood pressure today was normal, please find a blood pressure cuff from family or buy one from the pharmacy to monitor your blood pressure at home.  Record your blood pressure first thing in the morning while you are laying flat in your bed before you get out of bed.  High cholesterol: Continue  taking the atorvastatin  as prescribed, I have ordered a lipid panel to monitor your cholesterol today and will contact you regarding the results once they have arrived.  Please follow-up in 3 to 4 months  For any questions, please call the office at [phone] or send me a message in MyChart. Have a great day!  -Fairy Amy, MD  [facility] Resident, PGY-1

## 2024-11-06 LAB — LIPID PANEL
Chol/HDL Ratio: 3.1 ratio (ref 0.0–5.0)
Cholesterol, Total: 128 mg/dL (ref 100–199)
HDL: 41 mg/dL
LDL Chol Calc (NIH): 63 mg/dL (ref 0–99)
Triglycerides: 135 mg/dL (ref 0–149)
VLDL Cholesterol Cal: 24 mg/dL (ref 5–40)
